# Patient Record
Sex: Male | Born: 1995 | Race: Black or African American | Hispanic: No | Marital: Single | Smoking: Never smoker
Health system: Southern US, Community
[De-identification: ages and names within clinical notes are randomized; demographics above are authoritative.]

---
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
## 2017-04-05 ENCOUNTER — Emergency Department (HOSPITAL_COMMUNITY)
Admission: EM | Admit: 2017-04-05 | Discharge: 2017-04-05 | Disposition: A | Discharge status: Home / Self Care | Payer: BLUE CROSS/BLUE SHIELD | Attending: Emergency Medicine | Admitting: Emergency Medicine

## 2017-04-05 ENCOUNTER — Encounter (HOSPITAL_COMMUNITY): Payer: Self-pay | Admitting: Emergency Medicine

## 2017-04-05 DIAGNOSIS — Y92410 Unspecified street and highway as the place of occurrence of the external cause: Secondary | ICD-10-CM | POA: Diagnosis not present

## 2017-04-05 DIAGNOSIS — Y9389 Activity, other specified: Secondary | ICD-10-CM | POA: Insufficient documentation

## 2017-04-05 DIAGNOSIS — S161XXA Strain of muscle, fascia and tendon at neck level, initial encounter: Secondary | ICD-10-CM | POA: Diagnosis not present

## 2017-04-05 DIAGNOSIS — Y998 Other external cause status: Secondary | ICD-10-CM | POA: Diagnosis not present

## 2017-04-05 DIAGNOSIS — S199XXA Unspecified injury of neck, initial encounter: Secondary | ICD-10-CM | POA: Diagnosis present

## 2017-04-05 DIAGNOSIS — S7002XA Contusion of left hip, initial encounter: Secondary | ICD-10-CM | POA: Diagnosis not present

## 2017-04-05 MED ORDER — CYCLOBENZAPRINE HCL 10 MG PO TABS: 10.0000 mg | ORAL_TABLET | Freq: Two times a day (BID) | ORAL | 0 refills | Status: AC | PRN
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
## 2017-04-05 NOTE — Discharge Instructions (Signed)
Please read attached information. If you experience any new or worsening signs or symptoms please return to the emergency room for evaluation. Please follow-up with your primary care provider or specialist as discussed. Please use medication prescribed only as directed and discontinue taking if you have any concerning signs or symptoms.   °

## 2017-04-27 ENCOUNTER — Ambulatory Visit
Admission: RE | Admit: 2017-04-27 | Discharge: 2017-04-27 | Disposition: A | Discharge status: Home / Self Care | Payer: BLUE CROSS/BLUE SHIELD | Source: Ambulatory Visit | Attending: Internal Medicine | Admitting: Internal Medicine

## 2017-04-27 ENCOUNTER — Other Ambulatory Visit: Payer: Self-pay | Admitting: Internal Medicine

## 2017-04-27 DIAGNOSIS — M542 Cervicalgia: Secondary | ICD-10-CM

## 2018-05-20 ENCOUNTER — Emergency Department (HOSPITAL_COMMUNITY)
Admission: EM | Admit: 2018-05-20 | Discharge: 2018-05-20 | Disposition: A | Discharge status: Home / Self Care | Payer: BLUE CROSS/BLUE SHIELD | Attending: Emergency Medicine | Admitting: Emergency Medicine

## 2018-05-20 ENCOUNTER — Encounter (HOSPITAL_COMMUNITY): Payer: Self-pay

## 2018-05-20 ENCOUNTER — Other Ambulatory Visit: Payer: Self-pay

## 2018-05-20 DIAGNOSIS — Z79899 Other long term (current) drug therapy: Secondary | ICD-10-CM | POA: Insufficient documentation

## 2018-05-20 DIAGNOSIS — J039 Acute tonsillitis, unspecified: Secondary | ICD-10-CM | POA: Diagnosis not present

## 2018-05-20 DIAGNOSIS — J029 Acute pharyngitis, unspecified: Secondary | ICD-10-CM | POA: Diagnosis present

## 2018-05-20 MED ORDER — PREDNISONE 20 MG PO TABS: 40.0000 mg | ORAL_TABLET | Freq: Every day | ORAL | 0 refills | Status: AC

## 2018-05-20 MED ORDER — DEXAMETHASONE SODIUM PHOSPHATE 10 MG/ML IJ SOLN
10.0000 mg | Freq: Once | INTRAMUSCULAR | Status: DC
  Filled 2018-05-20: qty 1

## 2018-05-20 MED ORDER — GI COCKTAIL ~~LOC~~
30.0000 mL | Freq: Once | ORAL | Status: DC
  Filled 2018-05-20: qty 30

## 2018-05-20 MED ORDER — MAGIC MOUTHWASH W/LIDOCAINE: 10.0000 mL | Freq: Three times a day (TID) | ORAL | 1 refills | Status: AC | PRN

## 2018-05-20 NOTE — ED Notes (Signed)
Pt refused medication and throat swab, asked for a school note, states he has a appointment next week

## 2018-05-20 NOTE — Discharge Instructions (Signed)
We recommend follow-up with the ENT for further evaluation of your ongoing symptoms.  Continue prednisone as prescribed.  You may use Magic mouthwash for persistent pain management.  You may return to the ED for new or concerning symptoms.

## 2018-05-20 NOTE — ED Triage Notes (Signed)
Pt states that he has had a sore throat for the past month, has been tested for strep twice at [facility] and PCP, both negative, placed on antibiotics without relief. Denies fevers.

## 2018-05-20 NOTE — ED Provider Notes (Signed)
[facility] EMERGENCY DEPARTMENT Provider Note   CSN: [id number] Arrival date & time: 05/20/18  0340     History   Chief Complaint Chief Complaint  Patient presents with  . Sore Throat    HPI Connor Long is a 22 y.o. male.  22 year old male presents to the emergency department for evaluation of 1 month of dysphasia.  States that he has been experiencing a sore throat which has been unrelieved with a course of steroids as well as antibiotics.  Has been taking ibuprofen at home as well without significant relief.  States that his pain is aggravated with swallowing, especially foods.  Has been able to tolerate secretions and liquids.  States that he sometimes wakes to feel as though secretions have collected at the back of his throat, temporarily making it difficult for him to breathe.  Is able to clear secretions without incident.  Denies any fevers or sick contacts.  No shortness of breath.  Reports prior history of mononucleosis.  Had negative strep screen x2 as outpatient.  Was advised by urgent care to come to the emergency department if symptoms worsen.  The history is provided by the patient. No language interpreter was used.  Sore Throat     History reviewed. No pertinent past medical history.  There are no active problems to display for this patient.   History reviewed. No pertinent surgical history.      Home Medications    Prior to Admission medications   Medication Sig Start Date End Date Taking? Authorizing Provider  cyclobenzaprine (FLEXERIL) 10 MG tablet Take 1 tablet (10 mg total) by mouth 2 (two) times daily as needed for muscle spasms. 04/05/17   Hedges, Tinnie Gens, PA-C  magic mouthwash w/lidocaine SOLN Take 10 mLs by mouth 3 (three) times daily as needed for mouth pain. 05/20/18   Antony Madura, PA-C  predniSONE (DELTASONE) 20 MG tablet Take 2 tablets (40 mg total) by mouth daily. 05/20/18   Antony Madura, PA-C    Family History No  family history on file.  Social History Social History   Tobacco Use  . Smoking status: Never Smoker  . Smokeless tobacco: Never Used  Substance Use Topics  . Alcohol use: No  . Drug use: No     Allergies   Patient has no known allergies.   Review of Systems Review of Systems Ten systems reviewed and are negative for acute change, except as noted in the HPI.    Physical Exam Updated Vital Signs BP (!) 143/84   Pulse (!) 55   Temp 98.4 F (36.9 C) (Oral)   Resp 20   SpO2 100%   Physical Exam  Constitutional: He is oriented to person, place, and time. He appears well-developed and well-nourished. No distress.  Nontoxic appearing and in NAD  HENT:  Head: Normocephalic and atraumatic.  Tonsillar enlargement bilaterally.  Uvula midline.  There is mild posterior oropharyngeal edema and erythema.  No exudates noted.  Normal phonation.  Patient tolerating secretions without difficulty.  Trismus or tripoding.  Eyes: Conjunctivae and EOM are normal. No scleral icterus.  Neck: Normal range of motion.  No meningismus. No significant lymphadenopathy.  Pulmonary/Chest: Effort normal. No respiratory distress.  Respirations even and unlabored  Musculoskeletal: Normal range of motion.  Neurological: He is alert and oriented to person, place, and time. He exhibits normal muscle tone. Coordination normal.  Skin: Skin is warm and dry. No rash noted. He is not diaphoretic. No erythema. No pallor.  Psychiatric:  He has a normal mood and affect. His behavior is normal.  Nursing note and vitals reviewed.    ED Treatments / Results  Labs (all labs ordered are listed, but only abnormal results are displayed) Labs Reviewed  GC/CHLAMYDIA PROBE AMP () NOT AT [facility]    EKG None  Radiology No results found.  Procedures Procedures (including critical care time)  Medications Ordered in ED Medications  dexamethasone (DECADRON) injection 10 mg (has no administration in time  range)  gi cocktail (Maalox,Lidocaine,Donnatal) (has no administration in time range)     Initial Impression / Assessment and Plan / ED Course  I have reviewed the triage vital signs and the nursing notes.  Pertinent labs & imaging results that were available during my care of the patient were reviewed by me and considered in my medical decision making (see chart for details).     22 year old male with physical exam consistent with tonsillitis.  He is afebrile with stable vital signs.  He is tolerating secretions without trismus or tripoding.  He has normal phonation and no stridor.  No nuchal rigidity or meningismus.  Given chronicity of symptoms, will continue with outpatient supportive care.  He was given a dose of Decadron in the ED as well as a GI cocktail.  Will refer to ENT for follow-up.  I do not see indication for further antibiotics.  Return precautions discussed and provided. Patient discharged in stable condition with no unaddressed concerns.   Final Clinical Impressions(s) / ED Diagnoses   Final diagnoses:  Tonsillitis    ED Discharge Orders         Ordered    predniSONE (DELTASONE) 20 MG tablet  Daily     05/20/18 0410    magic mouthwash w/lidocaine SOLN  3 times daily PRN    Note to Pharmacy:  Doree Barthel, Maalox, Lidocaine at 1:1:1   05/20/18 0410           Antony Madura, PA-C 05/20/18 0417    Ward, Layla Maw, DO 05/20/18 0102
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
## 2022-11-27 DIAGNOSIS — Z6830 Body mass index (BMI) 30.0-30.9, adult: Secondary | ICD-10-CM | POA: Diagnosis not present

## 2022-11-27 DIAGNOSIS — H9203 Otalgia, bilateral: Secondary | ICD-10-CM | POA: Diagnosis not present

## 2024-07-14 DIAGNOSIS — R112 Nausea with vomiting, unspecified: Secondary | ICD-10-CM | POA: Diagnosis not present
